# Patient Record
Sex: Female | Born: 1960 | Race: White | Hispanic: No | Smoking: Never smoker
Health system: Southern US, Community
[De-identification: ages and names within clinical notes are randomized; demographics above are authoritative.]

## PROBLEM LIST (undated history)

## (undated) DIAGNOSIS — I10 Essential (primary) hypertension: Secondary | ICD-10-CM

---

## 1999-07-26 ENCOUNTER — Other Ambulatory Visit: Admission: RE | Admit: 1999-07-26 | Discharge: 1999-07-26 | Payer: Self-pay | Admitting: Family Medicine

## 2002-01-21 ENCOUNTER — Other Ambulatory Visit: Admission: RE | Admit: 2002-01-21 | Discharge: 2002-01-21 | Payer: Self-pay | Admitting: Obstetrics and Gynecology

## 2003-02-17 ENCOUNTER — Other Ambulatory Visit: Admission: RE | Admit: 2003-02-17 | Discharge: 2003-02-17 | Payer: Self-pay | Admitting: *Deleted

## 2003-05-19 ENCOUNTER — Encounter: Admission: RE | Admit: 2003-05-19 | Discharge: 2003-05-19 | Payer: Self-pay | Admitting: Gastroenterology

## 2003-05-22 ENCOUNTER — Encounter: Admission: RE | Admit: 2003-05-22 | Discharge: 2003-05-22 | Payer: Self-pay | Admitting: Gastroenterology

## 2003-05-26 ENCOUNTER — Ambulatory Visit (HOSPITAL_COMMUNITY): Admission: RE | Admit: 2003-05-26 | Discharge: 2003-05-26 | Payer: Self-pay | Admitting: Gastroenterology

## 2003-05-26 ENCOUNTER — Encounter (INDEPENDENT_AMBULATORY_CARE_PROVIDER_SITE_OTHER): Payer: Self-pay | Admitting: Specialist

## 2003-06-22 ENCOUNTER — Inpatient Hospital Stay (HOSPITAL_COMMUNITY): Admission: RE | Admit: 2003-06-22 | Discharge: 2003-06-29 | Payer: Self-pay | Admitting: General Surgery

## 2003-06-22 ENCOUNTER — Encounter (INDEPENDENT_AMBULATORY_CARE_PROVIDER_SITE_OTHER): Payer: Self-pay | Admitting: *Deleted

## 2003-07-21 ENCOUNTER — Ambulatory Visit (HOSPITAL_COMMUNITY): Admission: RE | Admit: 2003-07-21 | Discharge: 2003-07-21 | Payer: Self-pay | Admitting: Hematology and Oncology

## 2003-09-01 ENCOUNTER — Ambulatory Visit (HOSPITAL_COMMUNITY): Admission: RE | Admit: 2003-09-01 | Discharge: 2003-09-01 | Payer: Self-pay | Admitting: Gastroenterology

## 2003-11-10 ENCOUNTER — Ambulatory Visit (HOSPITAL_COMMUNITY): Admission: RE | Admit: 2003-11-10 | Discharge: 2003-11-10 | Payer: Self-pay | Admitting: Hematology and Oncology

## 2003-11-16 ENCOUNTER — Ambulatory Visit: Payer: Self-pay | Admitting: Hematology and Oncology

## 2004-01-11 ENCOUNTER — Ambulatory Visit: Payer: Self-pay | Admitting: Hematology and Oncology

## 2004-02-07 ENCOUNTER — Ambulatory Visit (HOSPITAL_COMMUNITY): Admission: RE | Admit: 2004-02-07 | Discharge: 2004-02-07 | Payer: Self-pay | Admitting: Hematology and Oncology

## 2004-05-08 ENCOUNTER — Ambulatory Visit (HOSPITAL_COMMUNITY): Admission: RE | Admit: 2004-05-08 | Discharge: 2004-05-08 | Payer: Self-pay | Admitting: Hematology and Oncology

## 2004-05-08 ENCOUNTER — Ambulatory Visit: Payer: Self-pay | Admitting: Hematology and Oncology

## 2004-06-20 ENCOUNTER — Ambulatory Visit: Payer: Self-pay | Admitting: Internal Medicine

## 2004-08-12 ENCOUNTER — Other Ambulatory Visit: Admission: RE | Admit: 2004-08-12 | Discharge: 2004-08-12 | Payer: Self-pay | Admitting: *Deleted

## 2004-08-21 ENCOUNTER — Ambulatory Visit: Payer: Self-pay | Admitting: Hematology and Oncology

## 2004-08-21 ENCOUNTER — Ambulatory Visit (HOSPITAL_COMMUNITY): Admission: RE | Admit: 2004-08-21 | Discharge: 2004-08-21 | Payer: Self-pay | Admitting: Hematology and Oncology

## 2004-10-09 ENCOUNTER — Other Ambulatory Visit: Admission: RE | Admit: 2004-10-09 | Discharge: 2004-10-09 | Payer: Self-pay | Admitting: *Deleted

## 2004-10-14 ENCOUNTER — Ambulatory Visit: Payer: Self-pay | Admitting: Internal Medicine

## 2004-11-08 ENCOUNTER — Ambulatory Visit (HOSPITAL_COMMUNITY): Admission: RE | Admit: 2004-11-08 | Discharge: 2004-11-08 | Payer: Self-pay | Admitting: Hematology and Oncology

## 2004-11-12 ENCOUNTER — Ambulatory Visit: Payer: Self-pay | Admitting: Hematology and Oncology

## 2005-02-19 ENCOUNTER — Ambulatory Visit: Payer: Self-pay | Admitting: Hematology and Oncology

## 2005-04-23 ENCOUNTER — Ambulatory Visit: Payer: Self-pay | Admitting: Internal Medicine

## 2005-05-21 ENCOUNTER — Ambulatory Visit: Payer: Self-pay | Admitting: Hematology and Oncology

## 2005-05-23 ENCOUNTER — Ambulatory Visit (HOSPITAL_COMMUNITY): Admission: RE | Admit: 2005-05-23 | Discharge: 2005-05-23 | Payer: Self-pay | Admitting: Hematology and Oncology

## 2005-05-23 LAB — COMPREHENSIVE METABOLIC PANEL
ALT: 17 U/L (ref 0–40)
AST: 21 U/L (ref 0–37)
Albumin: 4.1 g/dL (ref 3.5–5.2)
Alkaline Phosphatase: 82 U/L (ref 39–117)
CO2: 28 mEq/L (ref 19–32)
Calcium: 8.6 mg/dL (ref 8.4–10.5)
Chloride: 100 mEq/L (ref 96–112)
Sodium: 137 mEq/L (ref 135–145)
Total Protein: 7 g/dL (ref 6.0–8.3)

## 2005-05-23 LAB — CBC WITH DIFFERENTIAL/PLATELET
BASO%: 0.6 % (ref 0.0–2.0)
EOS%: 1.7 % (ref 0.0–7.0)
Eosinophils Absolute: 0.1 10*3/uL (ref 0.0–0.5)
LYMPH%: 19.6 % (ref 14.0–48.0)
MCHC: 34 g/dL (ref 32.0–36.0)
MONO%: 10 % (ref 0.0–13.0)
NEUT%: 68.1 % (ref 39.6–76.8)
lymph#: 0.9 10*3/uL (ref 0.9–3.3)

## 2005-08-13 ENCOUNTER — Ambulatory Visit: Payer: Self-pay | Admitting: Hematology and Oncology

## 2005-08-22 ENCOUNTER — Ambulatory Visit: Payer: Self-pay | Admitting: Internal Medicine

## 2005-12-31 ENCOUNTER — Other Ambulatory Visit: Admission: RE | Admit: 2005-12-31 | Discharge: 2005-12-31 | Payer: Self-pay | Admitting: *Deleted

## 2006-02-03 IMAGING — CT CT ABDOMEN W/ CM
1 of 4 series · 13 of 32 positions shown, 19 images · IV contrast (omnipaque)
Comparison: none

CLINICAL DATA: Gastrointestinal stromal tumor status post gastric surgery.  Three month follow-up.
TECHNIQUE: Multidetector helical CT imaging of the chest, abdomen, and pelvis was performed during the IV bolus injection of 150 cc of Omnipaque 300.  Oral contrast was given.  
 CT OF THE CHEST WITH CONTRAST:
 Correlation is made with prior studies done 02/07/04.  There are no enlarged mediastinal or hilar lymph nodes.  There is no pleural or pericardial effusion.  The lungs are clear.  There are no suspicious osseous findings.

[Series 2: cap w/iv 5.0 b30f · axial · 0.66mm/px · z∈[-562,-27]mm · 13 of 125 slices shown, 19 images]
[im 9/125  soft-tissue]
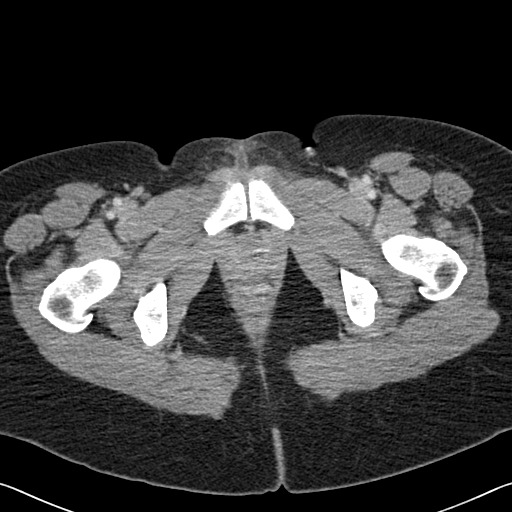
[im 9/125  bone]
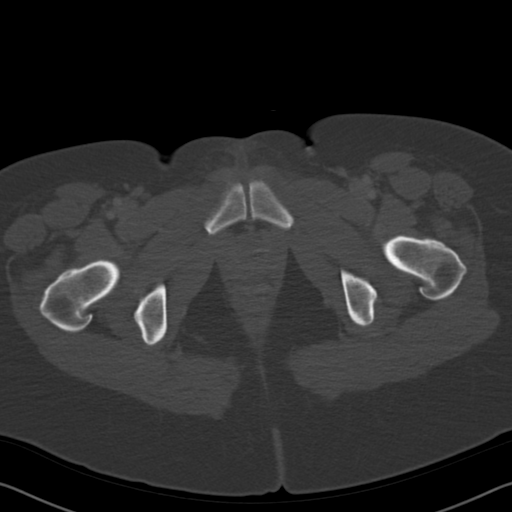
[im 17/125  soft-tissue]
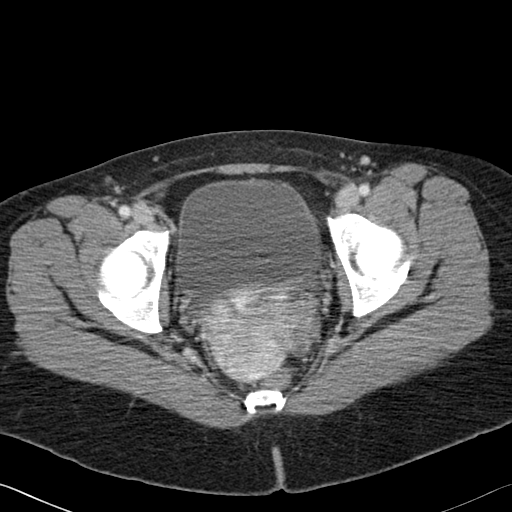
[im 25/125  soft-tissue]
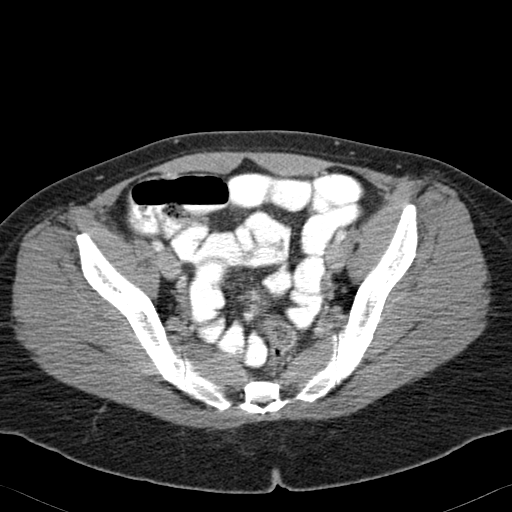
[im 34/125  soft-tissue]
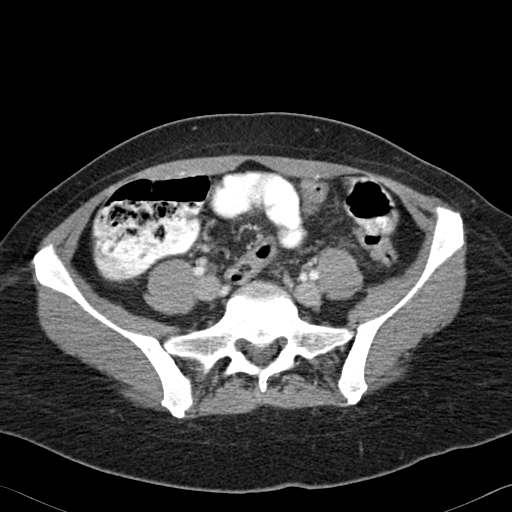
[im 42/125  soft-tissue]
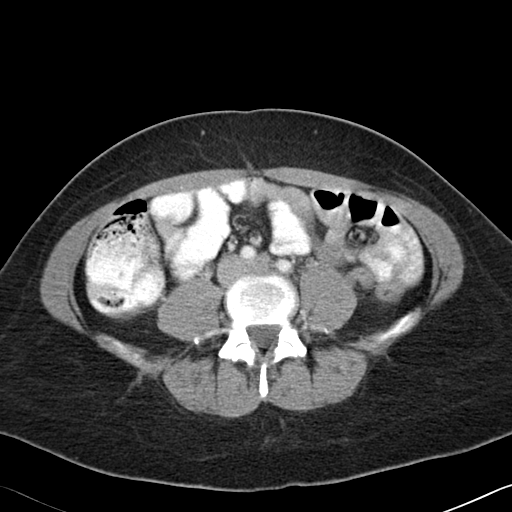
[im 50/125  soft-tissue]
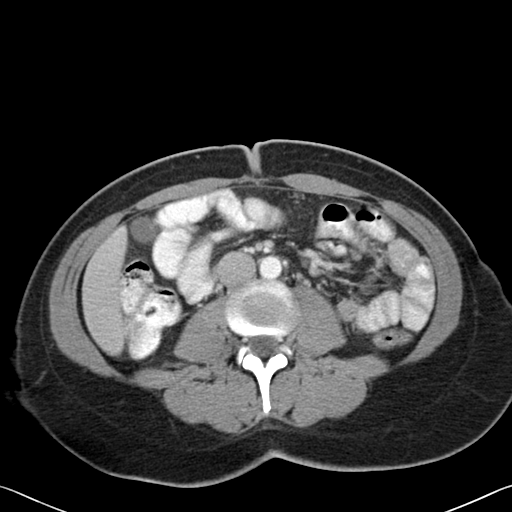
[im 67/125  soft-tissue]
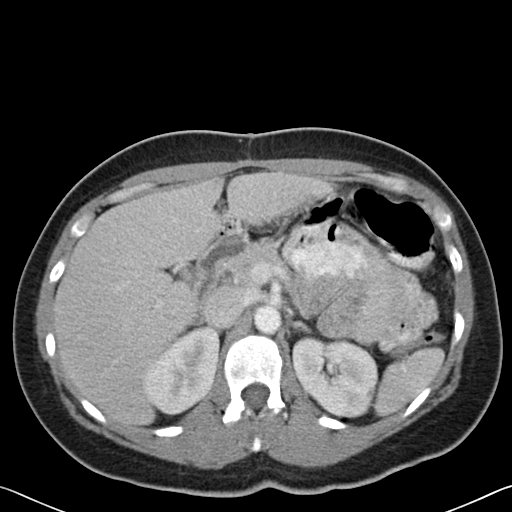
[im 75/125  soft-tissue]
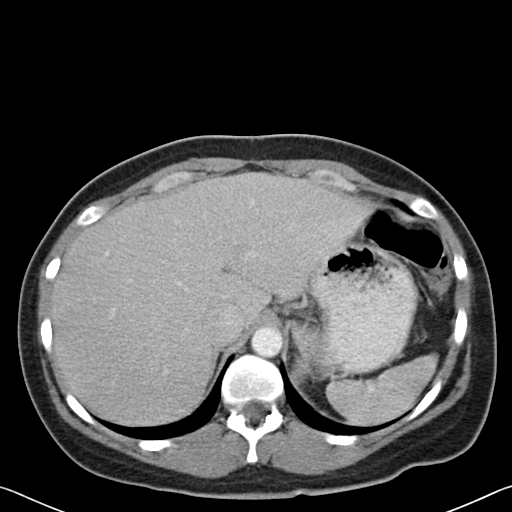
[im 83/125  soft-tissue]
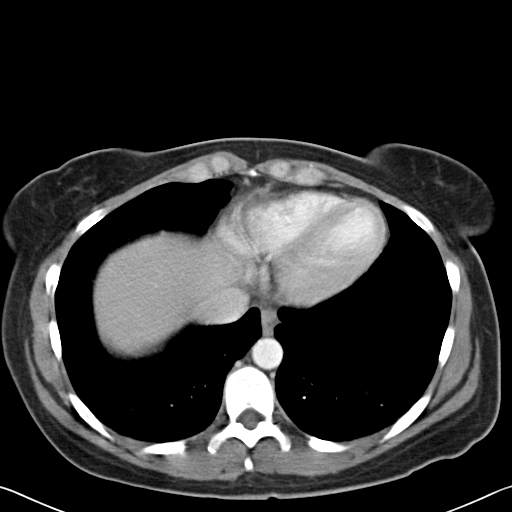
[im 83/125  bone]
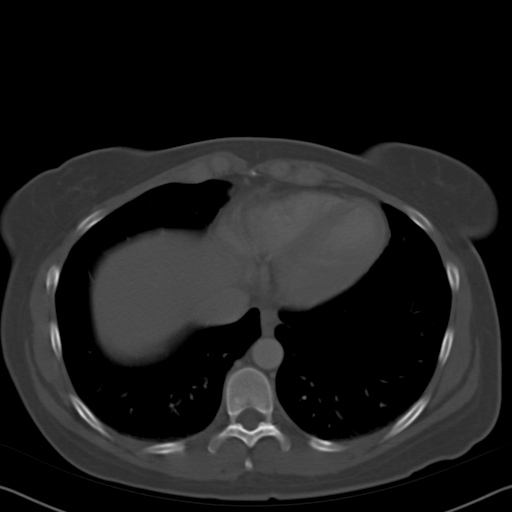
[im 91/125  soft-tissue]
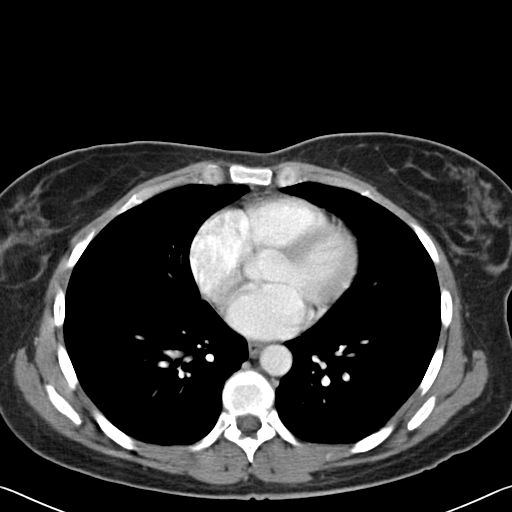
[im 91/125  lung]
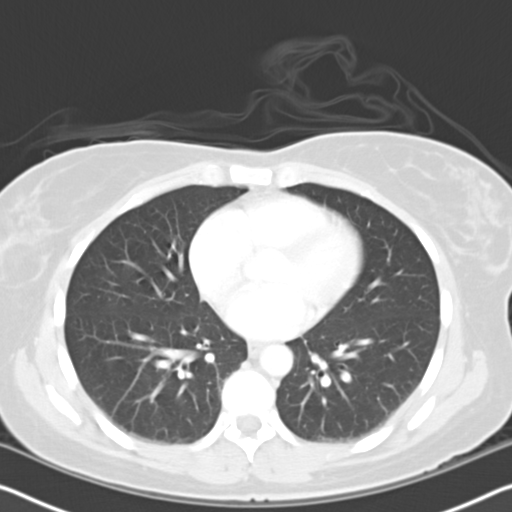
[im 100/125  soft-tissue]
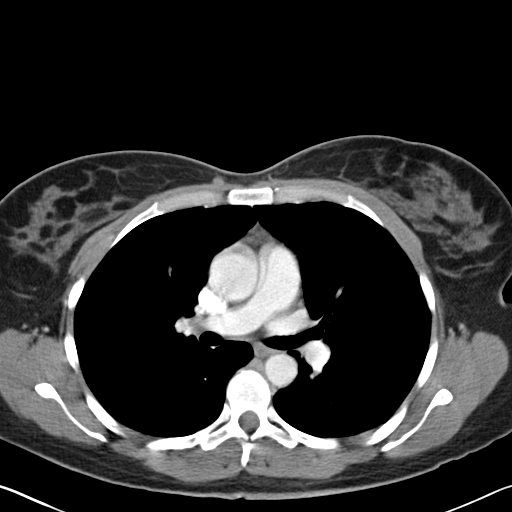
[im 100/125  lung]
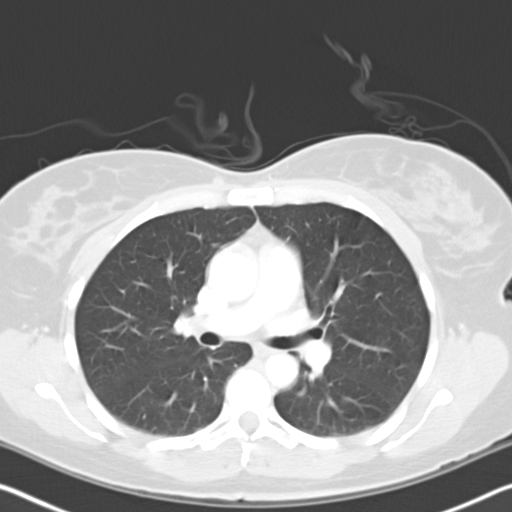
[im 108/125  soft-tissue]
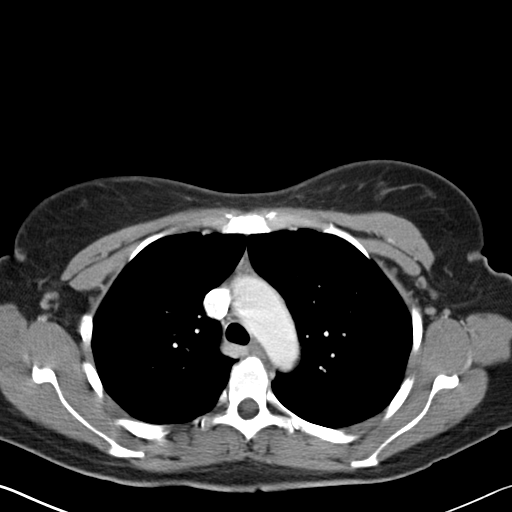
[im 108/125  lung]
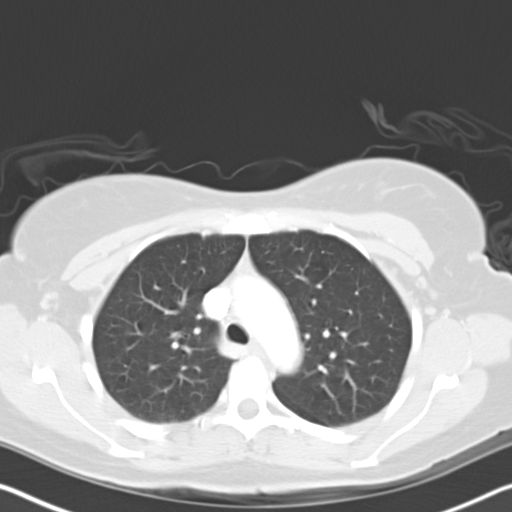
[im 116/125  soft-tissue]
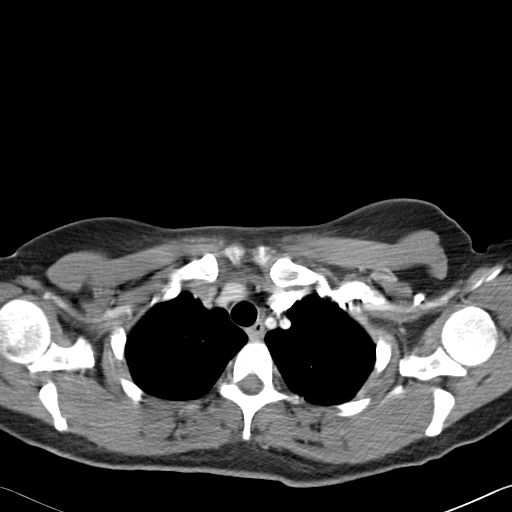
[im 116/125  lung]
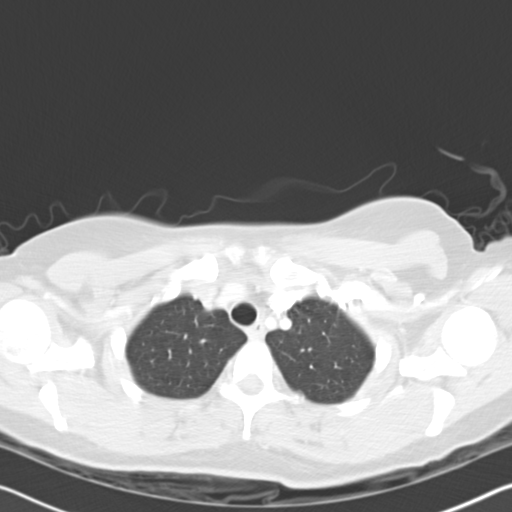

[13 of 32 positions shown; findings below may reference images not displayed]

IMPRESSION: Stable CT of the chest.  No evidence for metastatic disease.  
 CT OF THE ABDOMEN WITH CONTRAST:
 Postoperative changes of the stomach are noted without evidence of local recurrence.  Afferent loop is not opacified.  The liver, spleen, gallbladder, pancreas, and adrenal glands appear normal. The kidneys have stable appearances with a tiny low attenuation lesion in the upper pole of the left kidney most likely reflecting a cyst.  The right kidney appears normal.  No enlarged abdominal lymph nodes are demonstrated and there is no ascites or nodularity.
IMPRESSION: Stable postoperative CT of the abdomen.  No evidence of local recurrence or metastatic disease.
 CT   OF THE PELVIS WITH CONTRAST:
 There is no pelvic mass, fluid collection, or inflammatory process.  There is a probable follicle or small physiologic cyst of the left ovary measuring 1.6 cm in diameter.  Uterus and right ovary appear unremarkable.
IMPRESSION: No evidence of pelvic metastatic disease. No significant findings are demonstrated.
 Bone windows for the entire study were reviewed and demonstrate no evidence of metastatic disease.  A small sclerotic lesion in the left aspect of the sacrum is stable and probably a bone island 
 [REDACTED]

## 2006-10-30 ENCOUNTER — Ambulatory Visit: Payer: Self-pay | Admitting: Internal Medicine

## 2006-10-30 DIAGNOSIS — M545 Low back pain, unspecified: Secondary | ICD-10-CM | POA: Insufficient documentation

## 2006-10-30 DIAGNOSIS — J309 Allergic rhinitis, unspecified: Secondary | ICD-10-CM | POA: Insufficient documentation

## 2006-10-30 DIAGNOSIS — R5383 Other fatigue: Secondary | ICD-10-CM

## 2006-10-30 DIAGNOSIS — I10 Essential (primary) hypertension: Secondary | ICD-10-CM | POA: Insufficient documentation

## 2006-10-30 DIAGNOSIS — R5381 Other malaise: Secondary | ICD-10-CM | POA: Insufficient documentation

## 2006-10-30 LAB — CONVERTED CEMR LAB
ALT: 45 units/L — ABNORMAL HIGH (ref 0–35)
AST: 43 units/L — ABNORMAL HIGH (ref 0–37)
Albumin: 3.7 g/dL (ref 3.5–5.2)
Alkaline Phosphatase: 67 units/L (ref 39–117)
Basophils Absolute: 0 10*3/uL (ref 0.0–0.1)
CO2: 28 meq/L (ref 19–32)
Calcium: 9.2 mg/dL (ref 8.4–10.5)
Chloride: 106 meq/L (ref 96–112)
Eosinophils Relative: 2.5 % (ref 0.0–5.0)
GFR calc Af Amer: 87 mL/min
Glucose, Bld: 88 mg/dL (ref 70–99)
Lymphocytes Relative: 19.2 % (ref 12.0–46.0)
Neutro Abs: 3.7 10*3/uL (ref 1.4–7.7)
Neutrophils Relative %: 69.7 % (ref 43.0–77.0)
Potassium: 4.7 meq/L (ref 3.5–5.1)
TSH: 2.27 microintl units/mL (ref 0.35–5.50)
Total Bilirubin: 0.7 mg/dL (ref 0.3–1.2)
Total Protein: 6.4 g/dL (ref 6.0–8.3)
WBC: 5.2 10*3/uL (ref 4.5–10.5)

## 2006-11-04 ENCOUNTER — Telehealth: Payer: Self-pay | Admitting: Internal Medicine

## 2007-02-19 ENCOUNTER — Encounter: Admission: RE | Admit: 2007-02-19 | Discharge: 2007-02-19 | Payer: Self-pay | Admitting: Family Medicine

## 2007-07-06 ENCOUNTER — Ambulatory Visit: Payer: Self-pay | Admitting: Hematology and Oncology

## 2007-07-08 ENCOUNTER — Encounter: Admission: RE | Admit: 2007-07-08 | Discharge: 2007-07-08 | Payer: Self-pay | Admitting: Hematology and Oncology

## 2007-07-08 LAB — COMPREHENSIVE METABOLIC PANEL
ALT: 15 U/L (ref 0–35)
Albumin: 4.3 g/dL (ref 3.5–5.2)
Alkaline Phosphatase: 64 U/L (ref 39–117)
Creatinine, Ser: 1.16 mg/dL (ref 0.40–1.20)
Potassium: 4.7 mEq/L (ref 3.5–5.3)
Total Bilirubin: 0.3 mg/dL (ref 0.3–1.2)

## 2007-07-08 LAB — CBC WITH DIFFERENTIAL/PLATELET
Basophils Absolute: 0 10*3/uL (ref 0.0–0.1)
Eosinophils Absolute: 0.2 10*3/uL (ref 0.0–0.5)
HCT: 35.9 % (ref 34.8–46.6)
HGB: 12.6 g/dL (ref 11.6–15.9)
LYMPH%: 23.8 % (ref 14.0–48.0)
MCHC: 35 g/dL (ref 32.0–36.0)
MCV: 86 fL (ref 81.0–101.0)
NEUT#: 3.3 10*3/uL (ref 1.5–6.5)
Platelets: 329 10*3/uL (ref 145–400)
RBC: 4.18 10*6/uL (ref 3.70–5.32)
WBC: 5.3 10*3/uL (ref 3.9–10.0)
lymph#: 1.3 10*3/uL (ref 0.9–3.3)

## 2007-07-16 ENCOUNTER — Encounter: Payer: Self-pay | Admitting: Internal Medicine

## 2008-06-16 ENCOUNTER — Encounter: Admission: RE | Admit: 2008-06-16 | Discharge: 2008-06-16 | Payer: Self-pay | Admitting: Family Medicine

## 2008-07-12 ENCOUNTER — Ambulatory Visit: Payer: Self-pay | Admitting: Hematology and Oncology

## 2010-01-26 ENCOUNTER — Encounter: Payer: Self-pay | Admitting: Hematology and Oncology

## 2010-01-27 ENCOUNTER — Encounter: Payer: Self-pay | Admitting: Family Medicine

## 2010-01-28 ENCOUNTER — Encounter: Payer: Self-pay | Admitting: Family Medicine

## 2010-01-28 ENCOUNTER — Encounter: Payer: Self-pay | Admitting: Hematology and Oncology

## 2010-05-24 NOTE — Op Note (Signed)
NAME:  Erin Clayton, COIRO                         ACCOUNT NO.:  [id number]   MEDICAL RECORD NO.:  [id number]                   PATIENT TYPE:  AMB   LOCATION:  ENDO                                 FACILITY:  [facility]   PHYSICIAN:  Graylin Shiver, M.D.                DATE OF BIRTH:  25-Dec-1960   DATE OF PROCEDURE:  05/26/2003  DATE OF DISCHARGE:                                 OPERATIVE REPORT   PROCEDURE:  Esophagogastroduodenoscopy with biopsy.   INDICATIONS FOR PROCEDURE:  This patient is a 50 year old female who I saw  in the office on May 12, 2003 with complaints of a two-month history of some  abdominal pain just above the umbilicus.  It was a constant pain.  Nothing  seemed to make it better or worse and she recalls that it started when he  turned her body sideways at work.  I initially did not think that this was a  GI-related pain but went ahead and ordered an abdominal ultrasound which  showed a hypoechoic area in the region of the tail of the pancreas.  It was  felt that this could possibly be a focal area of pancreatitis.  However, it  did not seem like this clinically.  I then ordered a CT scan of the abdomen  which showed a 3 x 5.5 cm, lobulated mass with central calcification  involving the posterior wall of the distal gastric body.  This was  suspicious for GI stromal tumor with other differential considerations  including adenocarcinoma or lymphoma.  Endoscopy is being done today to  further evaluate the upper GI tract.   CONSENT:  Informed consent was obtained after explanation of the risks of  bleeding, infection and perforation.   PREMEDICATIONS:  Fentanyl 75 mcg IV, Versed 8 mg IV.   DESCRIPTION OF PROCEDURE:  With the patient in the left lateral decubitus  position, the Olympus gastroscope was inserted into the oropharynx and  passed into the esophagus.  It was advanced down the esophagus and into the  stomach and into the duodenum.  The second portion and bulb of the  duodenum  were normal.  The scope was then brought back into the stomach.  In the  distal body of the stomach, I could see a protrusion into the lumen.  A  multi-lobulated, extrinsic or intramural lesion.  The overlying gastric  mucosa looked normal.  I palpated this area with forceps and it was firm.  Biopsies were obtained from this area, but I suspect that they will not  reveal the true nature of this abnormality.  The pylorus appeared normal.  The upper fundus of the stomach showed two tiny polyps which were biopsied  off with cold forceps.  The esophagus looked normal.  The esophagogastric  junction was at 37 cm.  She  tolerated the procedure well without  complications.   IMPRESSION:  1. Extrinsic or intramural lesion  protruding into the gastric lumen and the     distal body of the stomach on the lesser curvature side.  Palpation of     this with forceps revealed it to be firm.  Biopsies were obtained, but I     do not feel that they will give [place] the true nature of the lesion.  2. Two small polyps in the gastric fundus which were biopsied.   PLAN:  At this point based on the endoscopic findings and CT scan findings,  I am going to make a surgical referral.                                               Graylin Shiver, M.D.    SFG/MEDQ  D:  05/26/2003  T:  05/26/2003  Job:  [id number]

## 2010-05-24 NOTE — Op Note (Signed)
NAME:  Erin Clayton, Erin Clayton NO.:  [id number]   MEDICAL RECORD NO.:  [id number]                   PATIENT TYPE:  AMB   LOCATION:  ENDO                                 FACILITY:  [facility]   PHYSICIAN:  Graylin Shiver, M.D.                DATE OF BIRTH:  01-24-1960   DATE OF PROCEDURE:  09/01/2003  DATE OF DISCHARGE:  09/01/2003                                 OPERATIVE REPORT   __________ The patient is a 50 year old female who underwent surgery  __________ with removal of a gastrointestinal stromal tumor.  She is  undergoing colonoscopy to evaluate for E. coli.   Informed consent was obtained after explanation of the risks of bleeding,  infection and perforation.   MEDICATIONS:  1. Fentanyl 120 mcg IV.  2. Versed 12 mg IV.   PROCEDURE:  Informed consent was obtained after explanation of the risks  including bleeding, infection, and perforation of the rectum.   The Olympus colonoscope was inserted into the rectum and advanced __________  to the cecum __________.  The cecum __________.  She tolerated the procedure  well without complications.   IMPRESSION:  __________.   This patient does have __________.  There are no polyps on examination  today.  I would recommend a __________ the oncology group here in town.                                               Graylin Shiver, M.D.    Germain Osgood  D:  09/01/2003  T:  09/03/2003  Job:  [id number]

## 2010-05-24 NOTE — Op Note (Signed)
NAME:  Erin Clayton, Erin Clayton                         ACCOUNT NO.:  [id number]   MEDICAL RECORD NO.:  [id number]                   PATIENT TYPE:  AMB   LOCATION:  ENDO                                 FACILITY:  [facility]   PHYSICIAN:  Graylin Shiver, M.D.                DATE OF BIRTH:  March 06, 1960   DATE OF PROCEDURE:  09/01/2003  DATE OF DISCHARGE:  09/01/2003                                 OPERATIVE REPORT   PROCEDURE:  Colonoscopy.   INDICATIONS FOR PROCEDURE:  This patient is a 50 year old female who  recently underwent surgery, with removal of a gastrointestinal stromal  tumor.  She is undergoing colonoscopy to evaluate the colon for any possible  signs of tumor.   Informed consent was obtained, after explanation of the risks of bleeding,  infection and perforation.   PREMEDICATION:  1.  Fentanyl 120 mcg IV.  2.  Versed 12 mg IV.   DESCRIPTION OF PROCEDURE:  With the patient in the left lateral decubitus  position, a rectal examination was performed.  No masses were felt.  The  Olympus colonoscope was inserted into the rectum and advanced around the  colon to the cecum.  Cecal landmarks were identified.  The cecum and  ascending colon were normal.  The transverse colon normal.  The descending  colon, sigmoid and rectum were normal.  She tolerated the procedure well  without complications.   IMPRESSION:  Normal colonoscopy to the cecum.                                               Graylin Shiver, M.D.    Germain Osgood  D:  09/08/2003  T:  09/09/2003  Job:  [id number]   cc:   Adolph Pollack, M.D.  [address], [address]  [place]  [place] [address]  Fax: [phone]   Vicente Serene I. Odogwu, M.D.  Fax: [phone]

## 2010-05-24 NOTE — Discharge Summary (Signed)
NAME:  Erin Clayton, Erin Clayton                         ACCOUNT NO.:  [id number]   MEDICAL RECORD NO.:  [id number]                   PATIENT TYPE:  INP   LOCATION:  5710                                 FACILITY:  [facility]   PHYSICIAN:  Adolph Pollack, M.D.            DATE OF BIRTH:  1960-08-28   DATE OF ADMISSION:  06/22/2003  DATE OF DISCHARGE:  06/29/2003                                 DISCHARGE SUMMARY   PRINCIPAL DISCHARGE DIAGNOSIS:  Gastrointestinal stromal tumor of the  stomach.   SECONDARY DIAGNOSIS:  Depression.   PROCEDURE:  Subtotal gastrectomy with Billroth II reconstruction, June 22, 2003.   REASON FOR ADMISSION:  This 50 year old female began having some epigastric  pain and then presented to Dr. Wandalee Ferdinand who did an evaluation, and she was  discovered to have a mass that was not mucosal-based in the body of the  stomach.  She is now brought to the operating room for the above procedure.   HOSPITAL COURSE:  She underwent the above operation and tolerated it well.  Pathology came back gastrointestinal stroma tumor with a high potential for  malignancy, but they could not definitely call it malignant at the time.  She had her NG tube in.  She started passing gas; the NG tube was  subsequently removed, and she was started on liquid diet her 5th  postoperative day.  She continued to improve and had a bowel movement, by  her 7th postoperative day was tolerating a soft diet and was able to be  discharged.   DISPOSITION:  Discharged to home June 29, 2003, in satisfactory condition.  She was given activity restrictions, told to take Tylox for pain and take a  laxative as needed.  We will arrange for her to have an outpatient oncology  follow up.  She is to see me back in 2 weeks.  Staples were removed prior to  discharge.                                                Adolph Pollack, M.D.    Erin Clayton  D:  07/28/2003  T:  07/30/2003  Job:  [id number]

## 2010-05-24 NOTE — Op Note (Signed)
NAME:  Erin Clayton, Erin Clayton NO.:  [id number]   MEDICAL RECORD NO.:  [id number]                   PATIENT TYPE:  INP   LOCATION:  5710                                 FACILITY:  [facility]   PHYSICIAN:  Adolph Pollack, M.D.            DATE OF BIRTH:  08-22-1960   DATE OF PROCEDURE:  06/22/2003  DATE OF DISCHARGE:                                 OPERATIVE REPORT   INDICATIONS:  This 50 year old female was having some supraumbilical  abdominal pain that seemed to be related to working but not related to  eating.  She presented to Dr. Evette Cristal for evaluation.  In evaluating her he  did an abdominal ultrasound, which demonstrated an abnormality in the region  of the pancreas.  CT scan was ordered, demonstrated a mass with central  calcification in the posterior wall of the distal gastric body.  Endoscopy  was performed and no mucosal lesion was noted, but extrinsic compression was  noted.  She has a little bit of early satiety and has had a little nausea at  times.  She now presents for exploratory laparotomy and resection of gastric  tumor.   TECHNIQUE:  She was seen in the holding area, then brought to the operating  room and placed supine on the operating table, and a general anesthetic was  administered.  A Foley catheter was placed in the bladder.  NG tube was  placed.  Abdominal wall was sterilely prepped and draped.  An upper midline  incision was made through the skin and subcutaneous tissue, fascia, and  peritoneum.  Self-retaining retractors were used for exposure.  The stomach  was palpated and the tumor was noted to be in the distal body going all the  way up to the proximal body area.  I began by dividing the gastrocolic  omentum up to the mid-fundal area.  I then isolated the pylorus and then  using an endoscopic stapler divided the duodenum just distal to the pylorus.  The lesser omentum was then divided.  It was then dissected and vessels  divided  between hemostats up to the midfundal area.  Of note was on the  posterior mobilization, there appeared to be an exophytic-type lesion but  there was no adherence to the pancreas.  I then divided the stomach with the  thick linear noncutting stapler and sent the specimen to pathology, where a  frozen section diagnosis favored a gastrointestinal stroma tumor but further  information could not be given.  I had at least a 5-6 cm margin grossly at  initial time of division.   I then made a defect in the transverse mesocolon and brought up a piece of  jejunum approximately 40 cm distal to the ligament of Treitz.  I placed it  in an end of stomach stump to side jejunal fashion and performed a stapled  anastomosis with a linear cutting stapler.  No bleeding was  noted.  I closed  the remaining enterotomy with interrupted 3-0 silk sutures.  The anastomosis  was patent, viable, and under no tension, sewed the stomach part of the  anastomosis to the transverse mesocolon defect with interrupted 3-0 silk  sutures, anchoring it here in a retrocolic fashion.  Thus a Billroth II  reconstruction in a retrocolic fashion.   I then evaluated the liver and the rest of the abdominal cavity and no  evidence of metastatic disease or other lesions were noted.  The abdominal  cavity was irrigated and no bleeding was noted.  The irrigation was  evacuated.  Needle, sponge, and instrument counts were reported to be  correct.  Omentum was placed over the intestine and the fascia was closed  with a running #1 PDS suture.  The subcutaneous tissue was irrigated and the  skin was closed with staples.  A sterile dressing was applied.   She tolerated the procedure well without any apparent complications.  She  subsequently was taken to the operating room in satisfactory condition.                                               Adolph Pollack, M.D.    Kari Baars  D:  06/22/2003  T:  06/23/2003  Job:  [id number]   cc:    Graylin Shiver, M.D.  [address]  [address]  [place], [place] [address]  Fax: [phone]

## 2011-08-26 ENCOUNTER — Other Ambulatory Visit (HOSPITAL_COMMUNITY): Payer: Self-pay | Admitting: Family Medicine

## 2011-08-26 DIAGNOSIS — Z1231 Encounter for screening mammogram for malignant neoplasm of breast: Secondary | ICD-10-CM

## 2011-09-19 ENCOUNTER — Ambulatory Visit (HOSPITAL_COMMUNITY)
Admission: RE | Admit: 2011-09-19 | Discharge: 2011-09-19 | Disposition: A | Discharge status: Home / Self Care | Payer: PRIVATE HEALTH INSURANCE | Source: Ambulatory Visit | Attending: Family Medicine | Admitting: Family Medicine

## 2011-09-19 DIAGNOSIS — Z1231 Encounter for screening mammogram for malignant neoplasm of breast: Secondary | ICD-10-CM | POA: Insufficient documentation

## 2012-10-15 ENCOUNTER — Telehealth: Payer: Self-pay | Admitting: Oncology

## 2012-10-15 NOTE — Telephone Encounter (Signed)
10/15/12 - Called patient on phone for the ACOSOG Z9001 study.  Patient states she is doing fine.  No problems except for some acid reflux.   She said she was due to have a physical and mammogram.  I stressed the importance of her having these done yearly.  Will contact her again next year.

## 2013-11-18 ENCOUNTER — Telehealth: Payer: Self-pay | Admitting: Oncology

## 2013-11-18 NOTE — Telephone Encounter (Signed)
11/04/13 - Called and spoke with patient on phone.  She states she is doing fine.  Was in a hurry to get back to work.  I caught her on her lunch break. I told her I would call her back later. 11/09/13 - Called patient but did not get an answer.  Left message for her to give me a call back.

## 2014-12-15 ENCOUNTER — Telehealth: Payer: Self-pay | Admitting: Oncology

## 2014-12-15 NOTE — Telephone Encounter (Signed)
Called and spoke to patient on the phone.  She states she is doing good.  Working all the time.  I told her she needs to keep yearly MD apts. Mammograms.  I thanked her for her support in this clinical trial.   After speaking with [name] at [facility] she states patient follow-up is for 10 years so this patient has now finished follow-up with this study. Erin Clayton 12/15/14 - 1:30 pm

## 2015-06-21 ENCOUNTER — Other Ambulatory Visit: Payer: Self-pay | Admitting: Nurse Practitioner

## 2015-06-21 DIAGNOSIS — Z1231 Encounter for screening mammogram for malignant neoplasm of breast: Secondary | ICD-10-CM

## 2015-07-02 ENCOUNTER — Ambulatory Visit: Payer: 59

## 2015-07-06 ENCOUNTER — Ambulatory Visit
Admission: RE | Admit: 2015-07-06 | Discharge: 2015-07-06 | Disposition: A | Discharge status: Home / Self Care | Payer: BLUE CROSS/BLUE SHIELD | Source: Ambulatory Visit | Attending: Nurse Practitioner | Admitting: Nurse Practitioner

## 2015-07-06 ENCOUNTER — Other Ambulatory Visit: Payer: Self-pay | Admitting: Nurse Practitioner

## 2015-07-06 DIAGNOSIS — N63 Unspecified lump in unspecified breast: Secondary | ICD-10-CM

## 2015-07-06 DIAGNOSIS — Z1231 Encounter for screening mammogram for malignant neoplasm of breast: Secondary | ICD-10-CM

## 2017-07-17 ENCOUNTER — Ambulatory Visit
Admission: RE | Admit: 2017-07-17 | Discharge: 2017-07-17 | Disposition: A | Discharge status: Home / Self Care | Payer: BLUE CROSS/BLUE SHIELD | Source: Ambulatory Visit | Attending: Sports Medicine | Admitting: Sports Medicine

## 2017-07-17 ENCOUNTER — Other Ambulatory Visit: Payer: Self-pay | Admitting: Sports Medicine

## 2017-07-17 ENCOUNTER — Other Ambulatory Visit (HOSPITAL_COMMUNITY): Payer: Self-pay | Admitting: Sports Medicine

## 2017-07-17 DIAGNOSIS — M25572 Pain in left ankle and joints of left foot: Secondary | ICD-10-CM

## 2020-09-26 ENCOUNTER — Encounter (HOSPITAL_BASED_OUTPATIENT_CLINIC_OR_DEPARTMENT_OTHER): Payer: Self-pay

## 2020-09-26 ENCOUNTER — Emergency Department (HOSPITAL_BASED_OUTPATIENT_CLINIC_OR_DEPARTMENT_OTHER): Payer: BLUE CROSS/BLUE SHIELD

## 2020-09-26 ENCOUNTER — Other Ambulatory Visit: Payer: Self-pay

## 2020-09-26 ENCOUNTER — Emergency Department (HOSPITAL_BASED_OUTPATIENT_CLINIC_OR_DEPARTMENT_OTHER)
Admission: EM | Admit: 2020-09-26 | Discharge: 2020-09-27 | Disposition: A | Discharge status: Home / Self Care | Payer: BLUE CROSS/BLUE SHIELD | Attending: Emergency Medicine | Admitting: Emergency Medicine

## 2020-09-26 DIAGNOSIS — S065X0A Traumatic subdural hemorrhage without loss of consciousness, initial encounter: Secondary | ICD-10-CM | POA: Diagnosis not present

## 2020-09-26 DIAGNOSIS — Y92410 Unspecified street and highway as the place of occurrence of the external cause: Secondary | ICD-10-CM | POA: Diagnosis not present

## 2020-09-26 DIAGNOSIS — R519 Headache, unspecified: Secondary | ICD-10-CM | POA: Insufficient documentation

## 2020-09-26 DIAGNOSIS — I10 Essential (primary) hypertension: Secondary | ICD-10-CM | POA: Diagnosis not present

## 2020-09-26 DIAGNOSIS — Z79899 Other long term (current) drug therapy: Secondary | ICD-10-CM | POA: Insufficient documentation

## 2020-09-26 DIAGNOSIS — R0789 Other chest pain: Secondary | ICD-10-CM | POA: Diagnosis not present

## 2020-09-26 DIAGNOSIS — S8011XA Contusion of right lower leg, initial encounter: Secondary | ICD-10-CM | POA: Diagnosis not present

## 2020-09-26 DIAGNOSIS — R109 Unspecified abdominal pain: Secondary | ICD-10-CM | POA: Diagnosis not present

## 2020-09-26 DIAGNOSIS — M542 Cervicalgia: Secondary | ICD-10-CM | POA: Insufficient documentation

## 2020-09-26 DIAGNOSIS — S8991XA Unspecified injury of right lower leg, initial encounter: Secondary | ICD-10-CM | POA: Diagnosis present

## 2020-09-26 DIAGNOSIS — S2000XA Contusion of breast, unspecified breast, initial encounter: Secondary | ICD-10-CM

## 2020-09-26 HISTORY — DX: Essential (primary) hypertension: I10

## 2020-09-26 MED ORDER — HYDROMORPHONE HCL 1 MG/ML IJ SOLN
1.0000 mg | Freq: Once | INTRAMUSCULAR | Status: AC
  Administered 2020-09-26: 1 mg via INTRAVENOUS
  Filled 2020-09-26: qty 1

## 2020-09-26 MED ORDER — ONDANSETRON HCL 4 MG/2ML IJ SOLN
4.0000 mg | Freq: Once | INTRAMUSCULAR | Status: AC
  Administered 2020-09-26: 4 mg via INTRAVENOUS
  Filled 2020-09-26: qty 2
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
## 2020-09-26 NOTE — ED Notes (Signed)
XR at bedside
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
## 2020-09-27 ENCOUNTER — Emergency Department (HOSPITAL_BASED_OUTPATIENT_CLINIC_OR_DEPARTMENT_OTHER)
Admission: EM | Admit: 2020-09-27 | Discharge: 2020-09-27 | Disposition: A | Discharge status: Home / Self Care | Payer: BLUE CROSS/BLUE SHIELD | Source: Home / Self Care | Attending: Emergency Medicine | Admitting: Emergency Medicine

## 2020-09-27 ENCOUNTER — Emergency Department (HOSPITAL_BASED_OUTPATIENT_CLINIC_OR_DEPARTMENT_OTHER): Payer: BLUE CROSS/BLUE SHIELD

## 2020-09-27 ENCOUNTER — Other Ambulatory Visit (HOSPITAL_BASED_OUTPATIENT_CLINIC_OR_DEPARTMENT_OTHER): Payer: Self-pay

## 2020-09-27 ENCOUNTER — Encounter (HOSPITAL_BASED_OUTPATIENT_CLINIC_OR_DEPARTMENT_OTHER): Payer: Self-pay | Admitting: *Deleted

## 2020-09-27 ENCOUNTER — Other Ambulatory Visit: Payer: Self-pay

## 2020-09-27 DIAGNOSIS — Y92410 Unspecified street and highway as the place of occurrence of the external cause: Secondary | ICD-10-CM | POA: Insufficient documentation

## 2020-09-27 DIAGNOSIS — Z79899 Other long term (current) drug therapy: Secondary | ICD-10-CM | POA: Insufficient documentation

## 2020-09-27 DIAGNOSIS — S065X0A Traumatic subdural hemorrhage without loss of consciousness, initial encounter: Secondary | ICD-10-CM | POA: Insufficient documentation

## 2020-09-27 DIAGNOSIS — I10 Essential (primary) hypertension: Secondary | ICD-10-CM | POA: Insufficient documentation

## 2020-09-27 DIAGNOSIS — S065X9D Traumatic subdural hemorrhage with loss of consciousness of unspecified duration, subsequent encounter: Secondary | ICD-10-CM

## 2020-09-27 LAB — CBC WITH DIFFERENTIAL/PLATELET
Abs Immature Granulocytes: 0.03 10*3/uL (ref 0.00–0.07)
Basophils Absolute: 0.1 10*3/uL (ref 0.0–0.1)
Basophils Relative: 1 %
Eosinophils Absolute: 0.1 10*3/uL (ref 0.0–0.5)
Eosinophils Relative: 1 %
HCT: 33.7 % — ABNORMAL LOW (ref 36.0–46.0)
Hemoglobin: 10.9 g/dL — ABNORMAL LOW (ref 12.0–15.0)
Immature Granulocytes: 0 %
Lymphocytes Relative: 11 %
Lymphs Abs: 0.9 10*3/uL (ref 0.7–4.0)
MCH: 27.3 pg (ref 26.0–34.0)
MCHC: 32.3 g/dL (ref 30.0–36.0)
MCV: 84.5 fL (ref 80.0–100.0)
Monocytes Absolute: 0.5 10*3/uL (ref 0.1–1.0)
Monocytes Relative: 7 %
Neutro Abs: 6.5 10*3/uL (ref 1.7–7.7)
Neutrophils Relative %: 80 %
Platelets: 294 10*3/uL (ref 150–400)
RBC: 3.99 MIL/uL (ref 3.87–5.11)
RDW: 14.6 % (ref 11.5–15.5)
WBC: 8.1 10*3/uL (ref 4.0–10.5)
nRBC: 0 % (ref 0.0–0.2)

## 2020-09-27 LAB — BASIC METABOLIC PANEL
Anion gap: 10 (ref 5–15)
BUN: 18 mg/dL (ref 6–20)
CO2: 23 mmol/L (ref 22–32)
Calcium: 8.9 mg/dL (ref 8.9–10.3)
Chloride: 106 mmol/L (ref 98–111)
Creatinine, Ser: 0.83 mg/dL (ref 0.44–1.00)
GFR, Estimated: >60 mL/min (ref >60)
Glucose, Bld: 111 mg/dL — ABNORMAL HIGH (ref 70–99)
Potassium: 3.8 mmol/L (ref 3.5–5.1)
Sodium: 139 mmol/L (ref 135–145)

## 2020-09-27 MED ORDER — LEVETIRACETAM 500 MG PO TABS
500.0000 mg | ORAL_TABLET | Freq: Once | ORAL | Status: AC
  Administered 2020-09-27: 500 mg via ORAL
  Filled 2020-09-27: qty 1

## 2020-09-27 MED ORDER — DICLOFENAC SODIUM ER 100 MG PO TB24: 100.0000 mg | ORAL_TABLET | Freq: Every day | ORAL | 0 refills | Status: AC

## 2020-09-27 MED ORDER — IBUPROFEN 800 MG PO TABS: 800.0000 mg | ORAL_TABLET | Freq: Three times a day (TID) | ORAL | 0 refills | Status: DC

## 2020-09-27 MED ORDER — IOHEXOL 350 MG/ML SOLN
100.0000 mL | Freq: Once | INTRAVENOUS | Status: AC | PRN
  Administered 2020-09-27: 75 mL via INTRAVENOUS

## 2020-09-27 MED ORDER — LIDOCAINE 5 % EX PTCH: 1.0000 | MEDICATED_PATCH | CUTANEOUS | 0 refills | Status: AC

## 2020-09-27 MED ORDER — LEVETIRACETAM 500 MG PO TABS
500.0000 mg | ORAL_TABLET | Freq: Two times a day (BID) | ORAL | 0 refills | Status: AC
  Filled 2020-09-27: qty 28, 14d supply, fill #0

## 2020-09-27 MED ORDER — FENTANYL CITRATE PF 50 MCG/ML IJ SOSY
25.0000 ug | PREFILLED_SYRINGE | Freq: Once | INTRAMUSCULAR | Status: AC
  Administered 2020-09-27: 25 ug via INTRAVENOUS
  Filled 2020-09-27: qty 1

## 2020-09-27 NOTE — Discharge Instructions (Addendum)
Take keppra twice a day to prevent seizures Follow up with neurosurgeon in 1-2 weeks  Return to ED for worsening symptoms
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
## 2020-09-27 NOTE — ED Triage Notes (Signed)
Patient came back in for repeat CT to check if her subdural hematoma is not getting bigger.

## 2020-09-27 NOTE — ED Provider Notes (Signed)
   7:49 AM Call from radiology. CT head report addendum created by Dr. Margo Aye with concerns for 66mm subdural falcine hemorrhage. From my understanding, patient was discharged last night after original CT head read as negative.   I was able to contact patient this morning and notify her of CT scan findings. Patient admits to worsening headache this morning. Denies neurological deficits. Pt recommended to return to ED for re-evaluation and repeat CT head to monitor subdural hemorrhage. Pt agreeable plan.   Dr. Edwin Dada  09/27/2020   Edwin Dada P, DO 09/27/20 [phone]

## 2020-09-27 NOTE — ED Notes (Signed)
ED Provider at bedside.
